# Patient Record
Sex: Female | Born: 2000
Health system: Southern US, Community
[De-identification: ages and names within clinical notes are randomized; demographics above are authoritative.]

## PROBLEM LIST (undated history)

## (undated) DIAGNOSIS — J45909 Unspecified asthma, uncomplicated: Secondary | ICD-10-CM

---
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
## 2022-07-19 IMAGING — CT CT HEAD W/O CM
4 series · 16 of 47 positions shown, 18 images · non-contrast
Comparison: CT of the orbit dated 12/04/2020.

CLINICAL DATA: 19-year-old female with facial trauma.

EXAM:
CT HEAD WITHOUT CONTRAST
TECHNIQUE: Contiguous axial images were obtained from the base of the skull
through the vertex without intravenous contrast.

[Series 2: head bone · axial · 0.42mm/px · z∈[-82,-52]mm · 3 of 77 slices shown]
[im 8/77  bone]
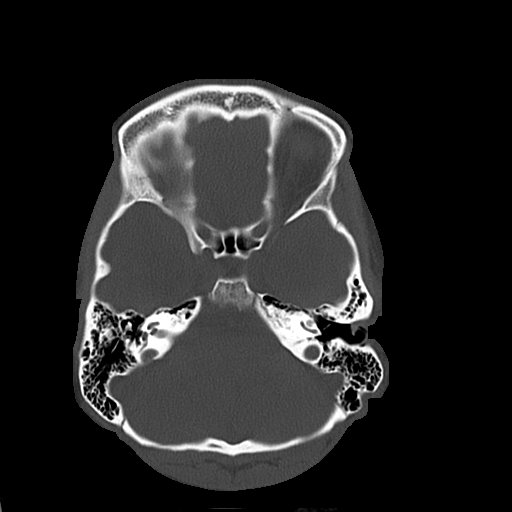
[im 16/77  bone]
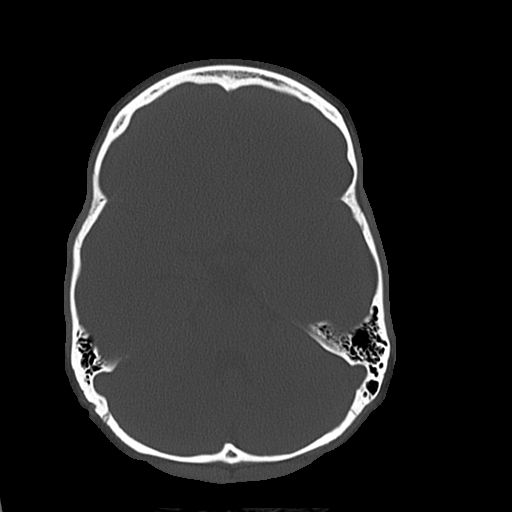
[im 23/77  bone]
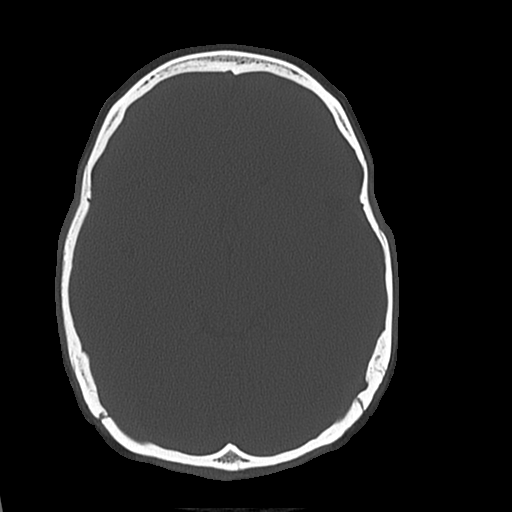

[Series 3: head wo · axial · 0.41mm/px · z∈[-81,+34]mm · 7 of 31 slices shown, 9 images]
[im 4/31  brain]
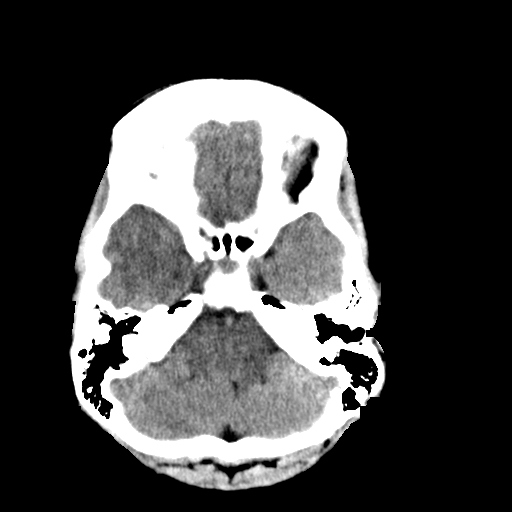
[im 4/31  bone]
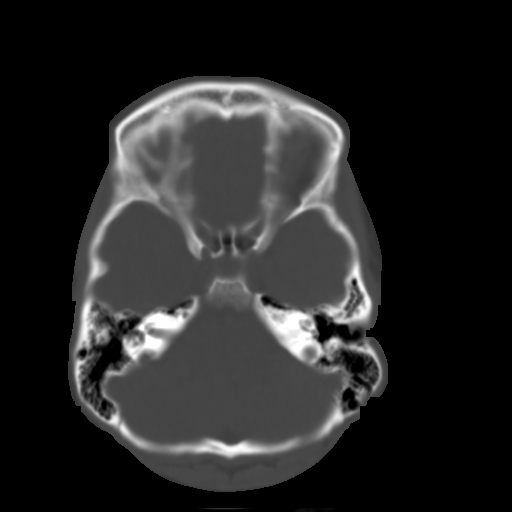
[im 8/31  brain]
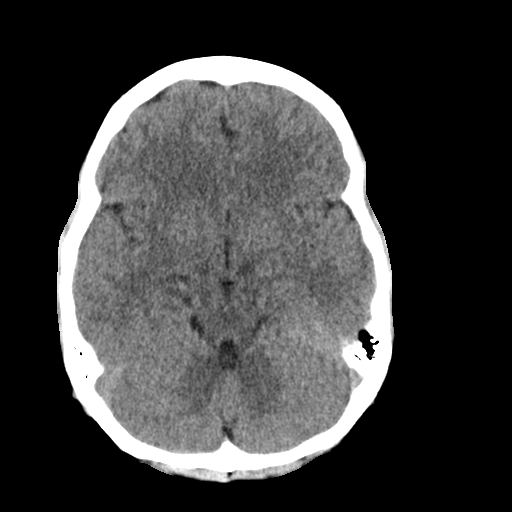
[im 12/31  brain]
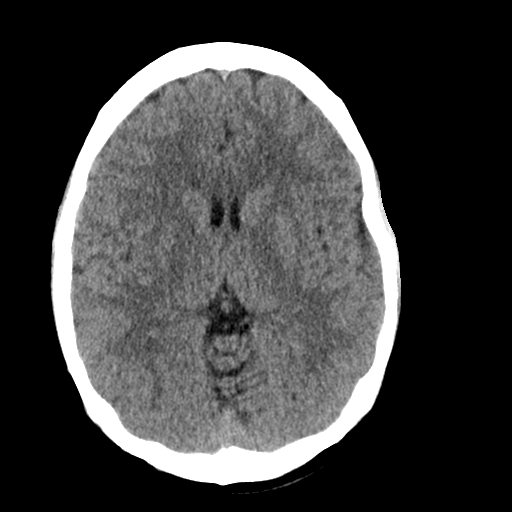
[im 16/31  brain]
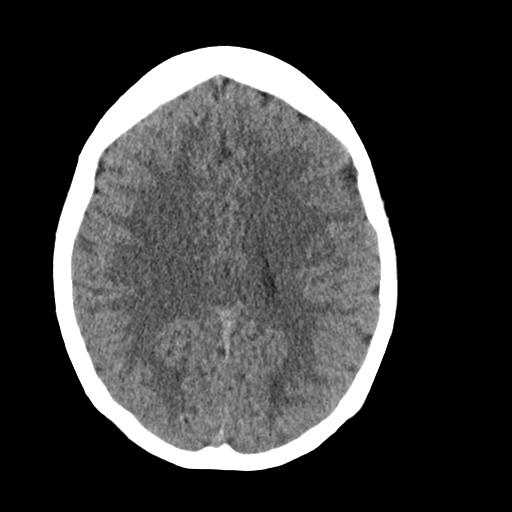
[im 19/31  brain]
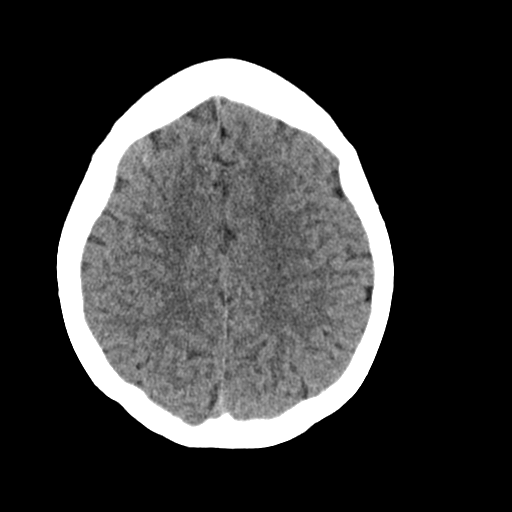
[im 19/31  bone]
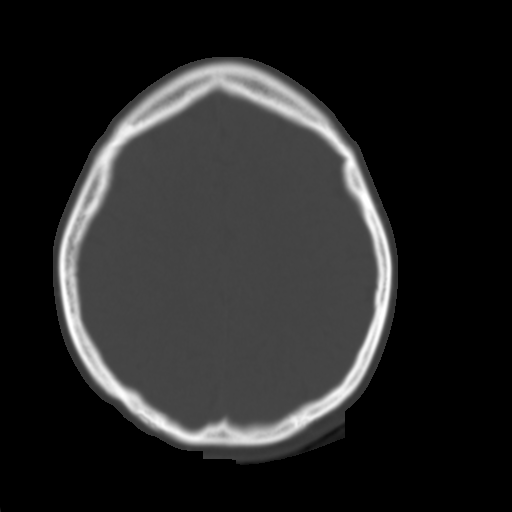
[im 23/31  brain]
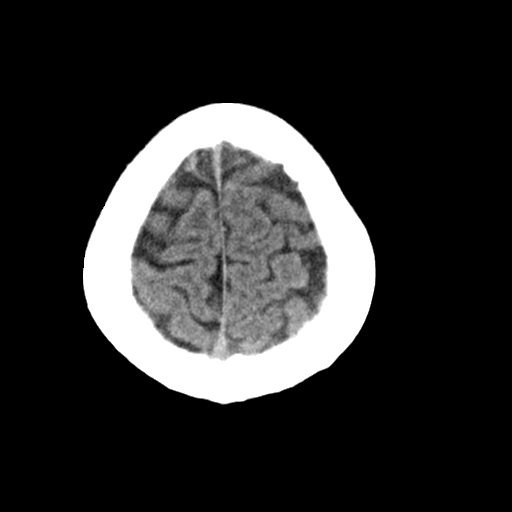
[im 27/31  brain]
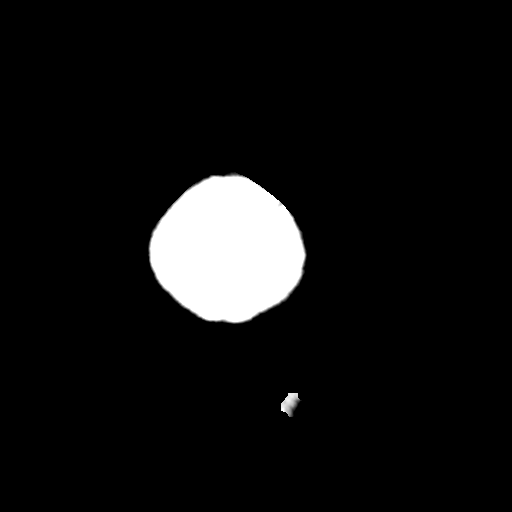

[Series 4: coronal soft · coronal · 0.27mm/px · 3 of 64 slices shown]
[im 22/64  brain]
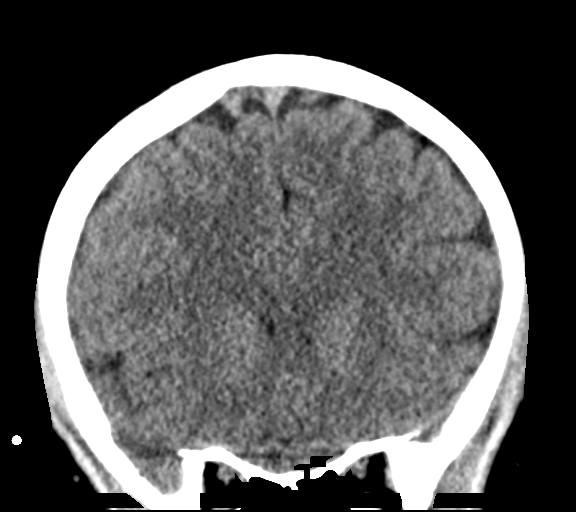
[im 29/64  brain]
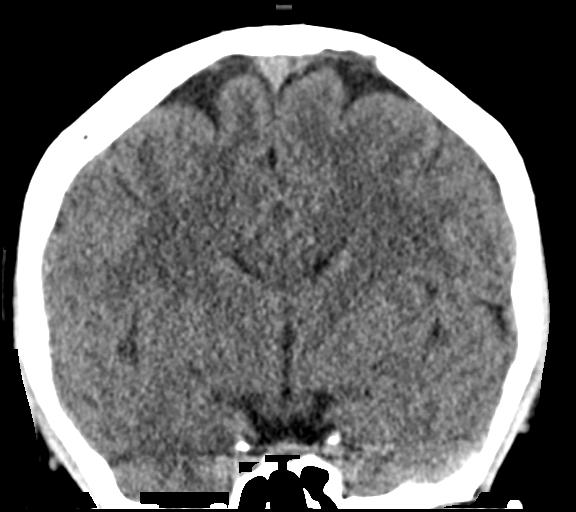
[im 36/64  brain]
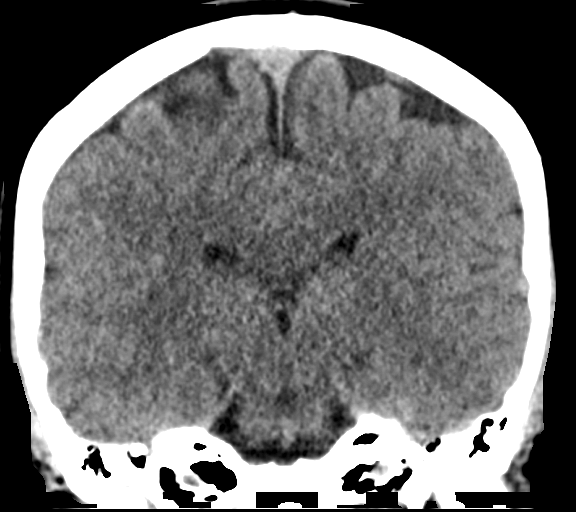

[Series 5: sagittal soft · sagittal · 0.27mm/px · 3 of 52 slices shown]
[im 18/52  brain]
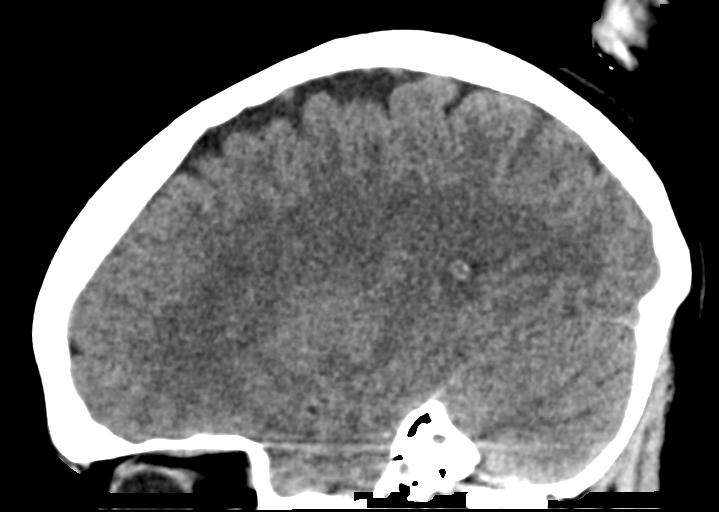
[im 26/52  brain]
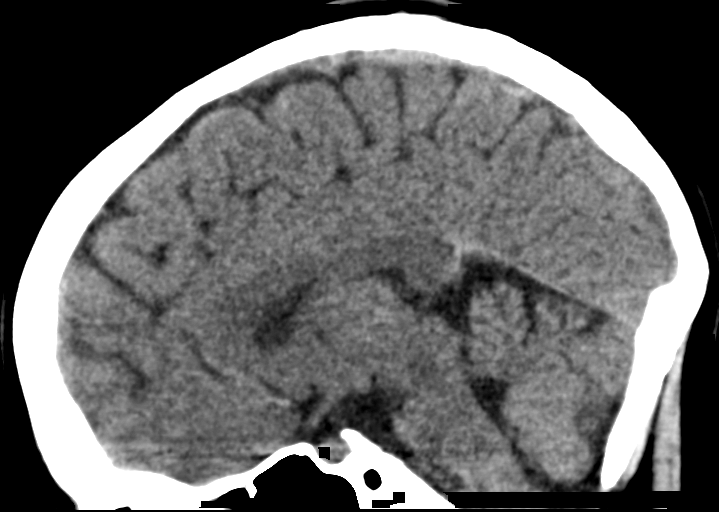
[im 35/52  brain]
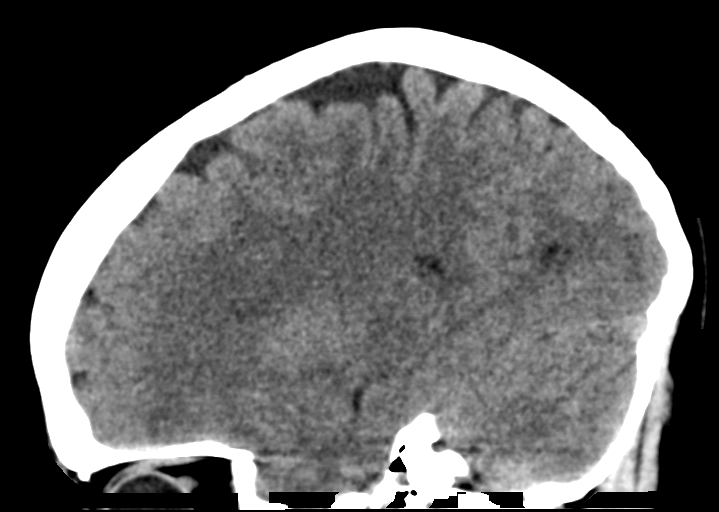

[16 of 47 positions shown; findings below may reference images not displayed]

FINDINGS: Brain: No evidence of acute infarction, hemorrhage, hydrocephalus,
extra-axial collection or mass lesion/mass effect.

Vascular: No hyperdense vessel or unexpected calcification.

Skull: Normal. Negative for fracture or focal lesion.

Sinuses/Orbits: No acute finding.

Other: None
IMPRESSION: Normal noncontrast CT of the brain.

## 2022-07-19 IMAGING — CT CT ORBITS W/O CM
3 series · 16 of 47 positions shown, 19 images · non-contrast
Comparison: Head CT dated 12/04/2020.

CLINICAL DATA: 19-year-old female with right ocular pain.

EXAM:
CT ORBITS WITHOUT CONTRAST
TECHNIQUE: Multidetector CT imaging of the orbits was performed using the
standard protocol without intravenous contrast. Multiplanar CT image
reconstructions were also generated.

[Series 3: 1 orbits soft · axial · 0.34mm/px · z∈[-130,-50]mm · 10 of 48 slices shown, 13 images]
[im 4/48  brain]
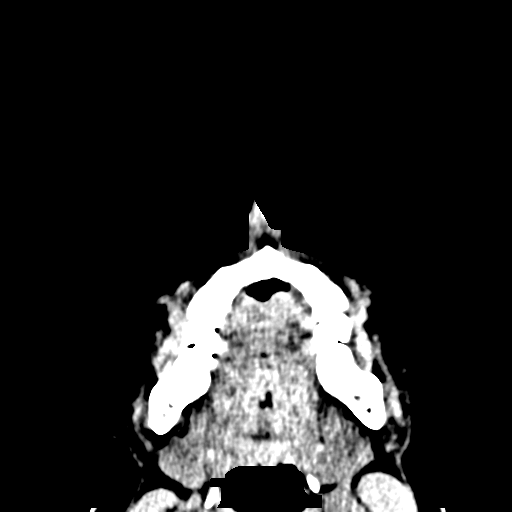
[im 4/48  bone]
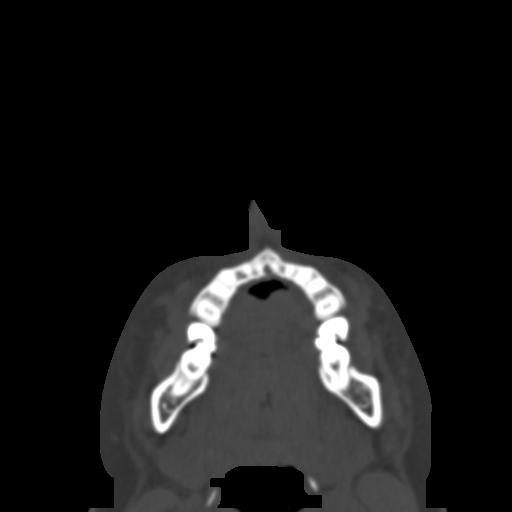
[im 9/48  bone]
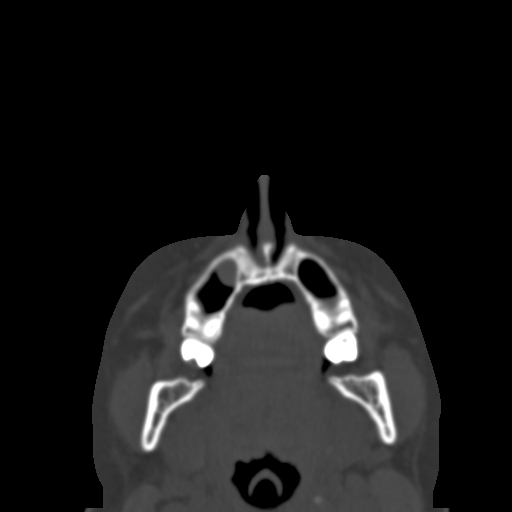
[im 13/48  bone]
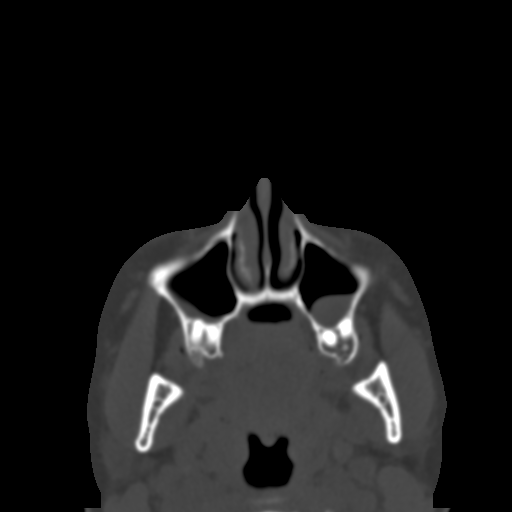
[im 17/48  bone]
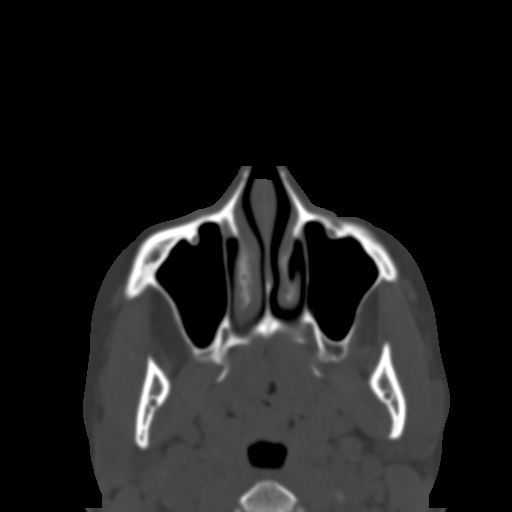
[im 22/48  brain]
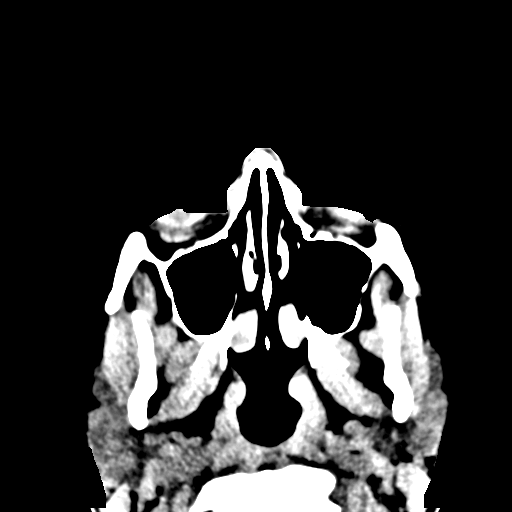
[im 22/48  bone]
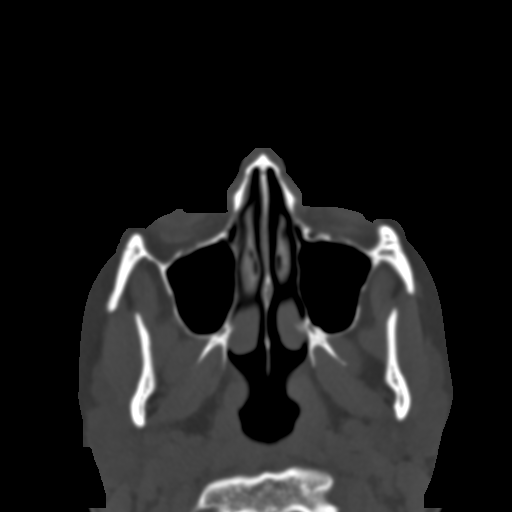
[im 26/48  bone]
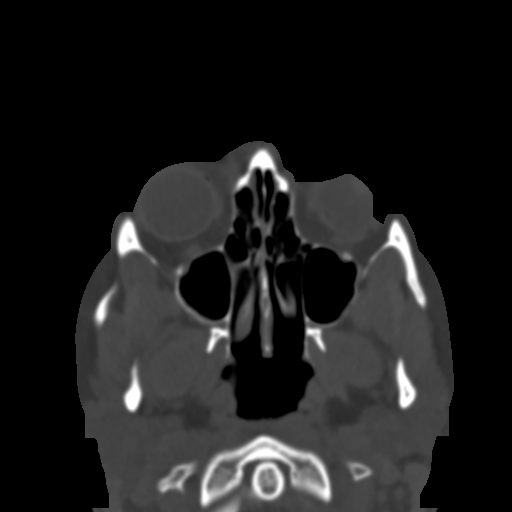
[im 31/48  bone]
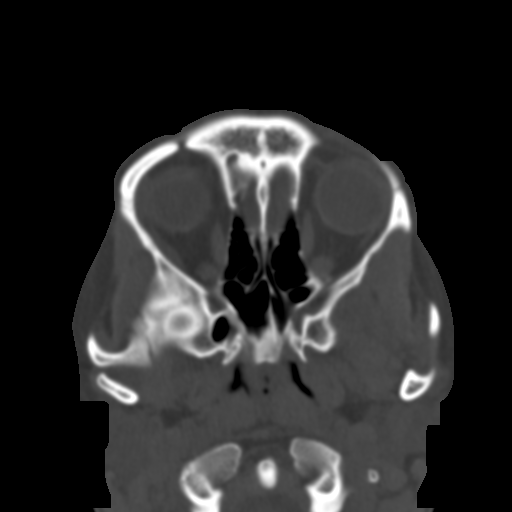
[im 36/48  bone]
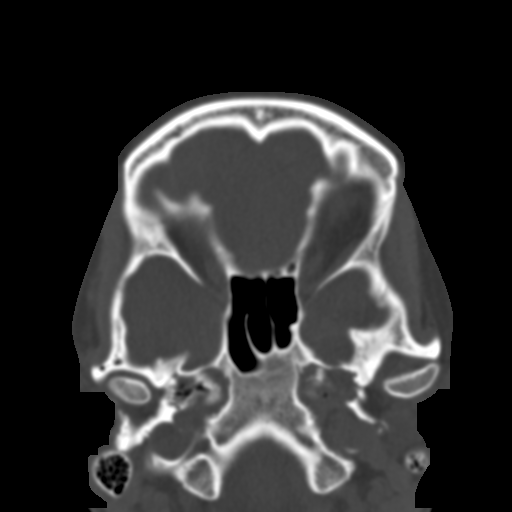
[im 39/48  brain]
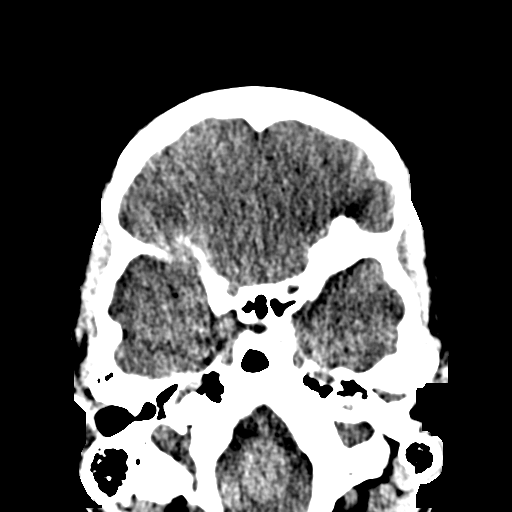
[im 39/48  bone]
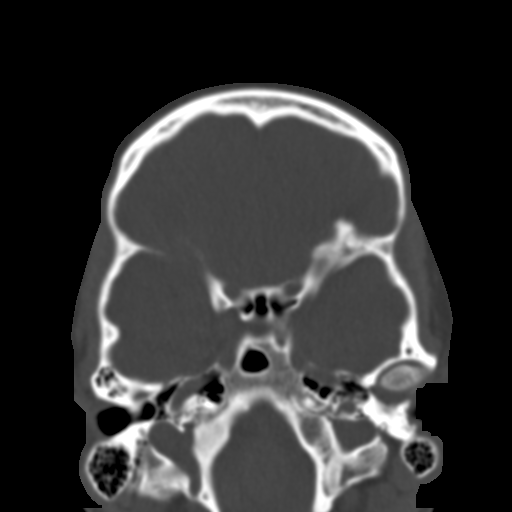
[im 44/48  bone]
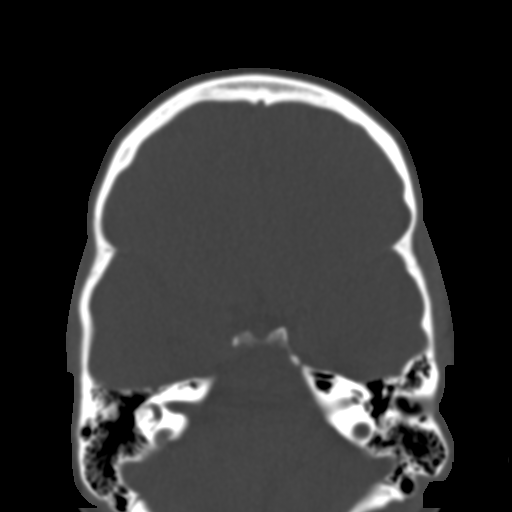

[Series 4: coronal soft · coronal · 0.23mm/px · 3 of 59 slices shown]
[im 20/59  bone]
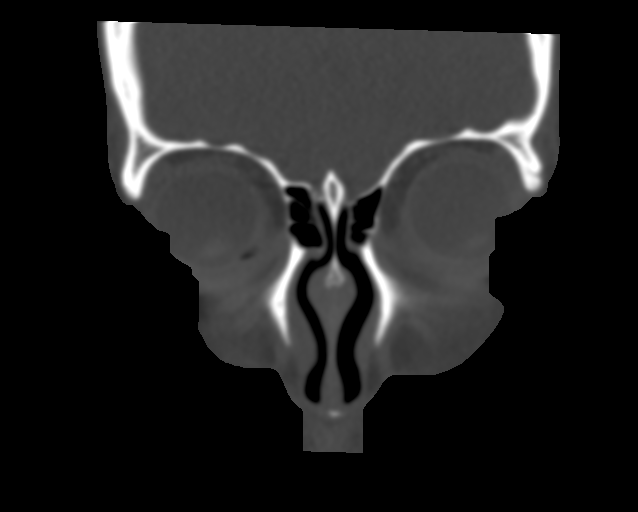
[im 26/59  bone]
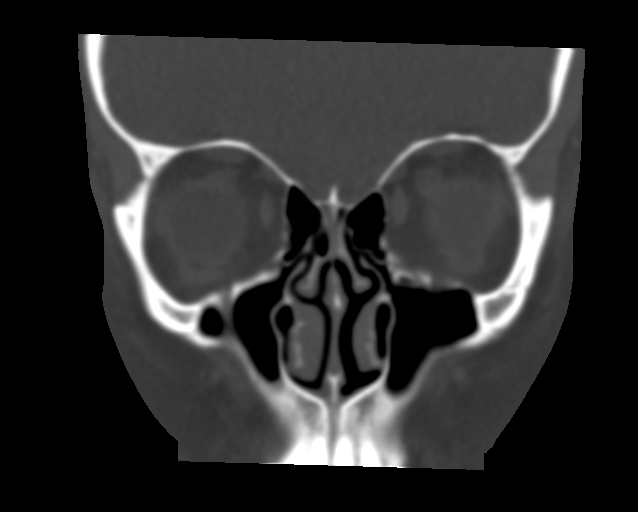
[im 33/59  bone]
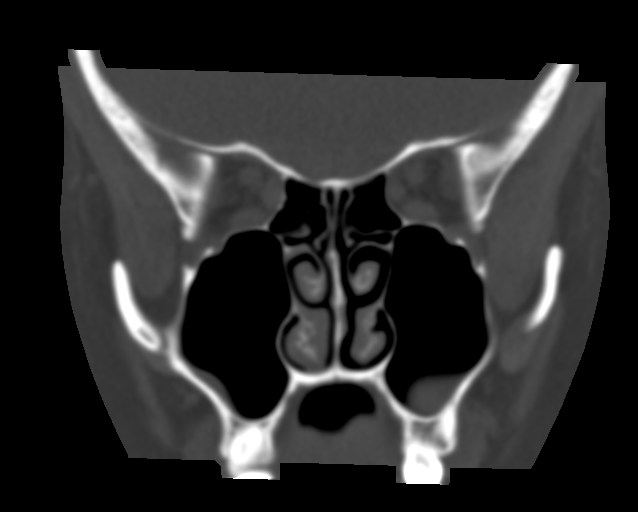

[Series 5: sagittal soft · sagittal · 0.23mm/px · 3 of 74 slices shown]
[im 25/74  bone]
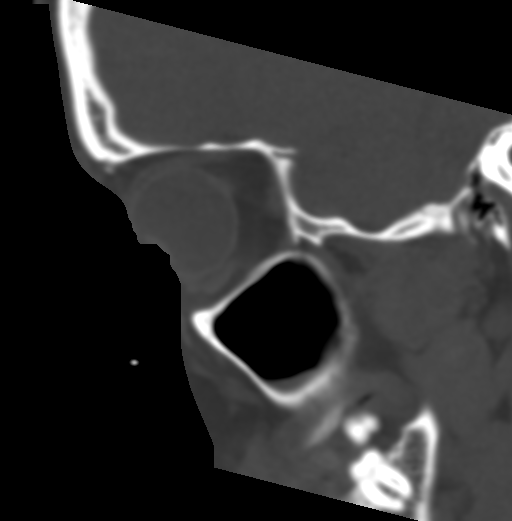
[im 37/74  bone]
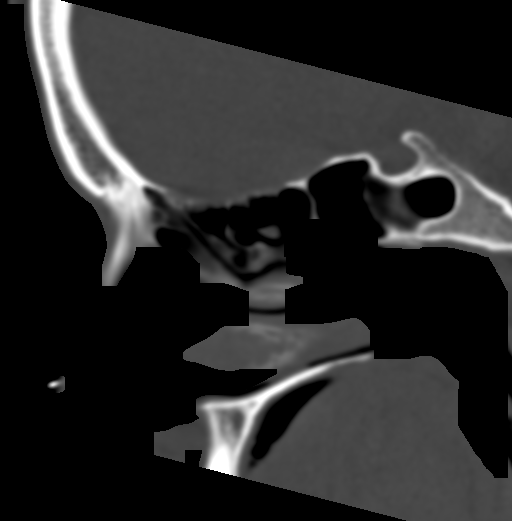
[im 49/74  bone]
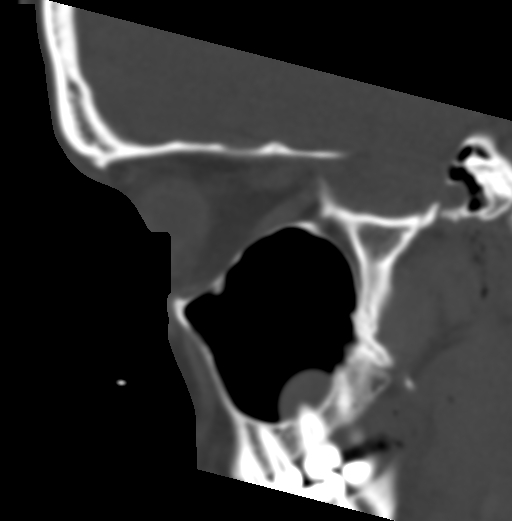

[16 of 47 positions shown; findings below may reference images not displayed]

FINDINGS: Orbits: No orbital mass or evidence of inflammation. Normal
appearance of the globes, optic nerve-sheath complexes, extraocular
muscles, orbital fat and lacrimal glands.

Visible paranasal sinuses: Mild mucoperiosteal thickening of the
right maxillary sinus. Small bilateral maxillary sinus retention
cysts or polyps measures 15 mm on the left. No air-fluid level. The
mastoid air cells are clear as visualized.

Soft tissues: Normal.

Osseous: No fracture or aggressive lesion.

Limited intracranial: No acute or significant finding.
IMPRESSION: 1. Unremarkable noncontrast CT of the orbits.
2. Mild right maxillary sinus disease. Small bilateral maxillary
sinus retention cysts or polyps.
